# Patient Record
Sex: Male | Born: 1990 | Race: White | Hispanic: No | Marital: Married | Smoking: Current every day smoker
Health system: Southern US, Community
[De-identification: ages and names within clinical notes are randomized; demographics above are authoritative.]

---

## 2014-03-22 ENCOUNTER — Encounter (HOSPITAL_COMMUNITY): Payer: Self-pay | Admitting: Emergency Medicine

## 2014-03-22 ENCOUNTER — Emergency Department (HOSPITAL_COMMUNITY)
Admission: EM | Admit: 2014-03-22 | Discharge: 2014-03-22 | Disposition: A | Discharge status: Home / Self Care | Payer: BC Managed Care – PPO | Attending: Emergency Medicine | Admitting: Emergency Medicine

## 2014-03-22 ENCOUNTER — Emergency Department (HOSPITAL_COMMUNITY): Payer: BC Managed Care – PPO

## 2014-03-22 DIAGNOSIS — N433 Hydrocele, unspecified: Secondary | ICD-10-CM | POA: Insufficient documentation

## 2014-03-22 DIAGNOSIS — N50819 Testicular pain, unspecified: Secondary | ICD-10-CM

## 2014-03-22 DIAGNOSIS — F17200 Nicotine dependence, unspecified, uncomplicated: Secondary | ICD-10-CM | POA: Insufficient documentation

## 2014-03-22 DIAGNOSIS — N509 Disorder of male genital organs, unspecified: Secondary | ICD-10-CM | POA: Insufficient documentation

## 2014-03-22 LAB — CBC WITH DIFFERENTIAL/PLATELET
Basophils Absolute: 0 10*3/uL (ref 0.0–0.1)
Basophils Relative: 0 % (ref 0–1)
Eosinophils Absolute: 0.1 10*3/uL (ref 0.0–0.7)
Eosinophils Relative: 1 % (ref 0–5)
HCT: 45.8 % (ref 39.0–52.0)
Hemoglobin: 16.7 g/dL (ref 13.0–17.0)
LYMPHS ABS: 1.7 10*3/uL (ref 0.7–4.0)
LYMPHS PCT: 15 % (ref 12–46)
MCH: 30.3 pg (ref 26.0–34.0)
MCHC: 36.5 g/dL — ABNORMAL HIGH (ref 30.0–36.0)
MCV: 83.1 fL (ref 78.0–100.0)
Monocytes Absolute: 0.7 10*3/uL (ref 0.1–1.0)
Monocytes Relative: 7 % (ref 3–12)
NEUTROS PCT: 77 % (ref 43–77)
Neutro Abs: 8.4 10*3/uL — ABNORMAL HIGH (ref 1.7–7.7)
PLATELETS: 172 10*3/uL (ref 150–400)
RBC: 5.51 MIL/uL (ref 4.22–5.81)
RDW: 11.6 % (ref 11.5–15.5)
WBC: 10.9 10*3/uL — ABNORMAL HIGH (ref 4.0–10.5)

## 2014-03-22 LAB — URINALYSIS, ROUTINE W REFLEX MICROSCOPIC
BILIRUBIN URINE: NEGATIVE
GLUCOSE, UA: NEGATIVE mg/dL
HGB URINE DIPSTICK: NEGATIVE
KETONES UR: NEGATIVE mg/dL
Leukocytes, UA: NEGATIVE
Nitrite: NEGATIVE
PROTEIN: NEGATIVE mg/dL
Specific Gravity, Urine: 1.016 (ref 1.005–1.030)
UROBILINOGEN UA: 1 mg/dL (ref 0.0–1.0)
pH: 7.5 (ref 5.0–8.0)

## 2014-03-22 LAB — COMPREHENSIVE METABOLIC PANEL
ALK PHOS: 140 U/L — AB (ref 39–117)
ALT: 64 U/L — AB (ref 0–53)
ANION GAP: 14 (ref 5–15)
AST: 31 U/L (ref 0–37)
Albumin: 4.6 g/dL (ref 3.5–5.2)
BILIRUBIN TOTAL: 1.2 mg/dL (ref 0.3–1.2)
BUN: 9 mg/dL (ref 6–23)
CO2: 25 meq/L (ref 19–32)
Calcium: 10.2 mg/dL (ref 8.4–10.5)
Chloride: 101 mEq/L (ref 96–112)
Creatinine, Ser: 0.72 mg/dL (ref 0.50–1.35)
GFR calc Af Amer: >90 mL/min (ref >90)
Glucose, Bld: 107 mg/dL — ABNORMAL HIGH (ref 70–99)
POTASSIUM: 4.2 meq/L (ref 3.7–5.3)
SODIUM: 140 meq/L (ref 137–147)
Total Protein: 8 g/dL (ref 6.0–8.3)

## 2014-03-22 LAB — LIPASE, BLOOD: Lipase: 22 U/L (ref 11–59)

## 2014-03-22 MED ORDER — MORPHINE SULFATE 4 MG/ML IJ SOLN
4.0000 mg | Freq: Once | INTRAMUSCULAR | Status: AC
  Administered 2014-03-22: 4 mg via INTRAVENOUS
  Filled 2014-03-22: qty 1

## 2014-03-22 MED ORDER — ONDANSETRON HCL 4 MG/2ML IJ SOLN
4.0000 mg | Freq: Once | INTRAMUSCULAR | Status: AC
  Administered 2014-03-22: 4 mg via INTRAVENOUS
  Filled 2014-03-22: qty 2

## 2014-03-22 MED ORDER — HYDROCODONE-ACETAMINOPHEN 5-325 MG PO TABS: 1.0000 | ORAL_TABLET | Freq: Four times a day (QID) | ORAL | Status: DC | PRN

## 2014-03-22 MED ORDER — IBUPROFEN 800 MG PO TABS: 800.0000 mg | ORAL_TABLET | Freq: Three times a day (TID) | ORAL | Status: DC

## 2014-03-22 NOTE — ED Provider Notes (Signed)
CSN: [id number]     Arrival date & time 03/22/14  0731 History   First MD Initiated Contact with Patient 03/22/14 0815     Chief Complaint  Patient presents with  . Abdominal Pain  . Testicle Pain     (Consider location/radiation/quality/duration/timing/severity/associated sxs/prior Treatment) Patient is a 23 y.o. male presenting with abdominal pain, testicular pain, and male genitourinary complaint. The history is provided by the patient.  Abdominal Pain Pain location:  LLQ Associated symptoms: no cough, no dysuria, no fever, no shortness of breath and no vomiting   Testicle Pain This is a new problem. The current episode started 3 to 5 hours ago. The problem occurs constantly. The problem has been gradually improving. Pertinent negatives include no abdominal pain and no shortness of breath. Nothing aggravates the symptoms. Nothing relieves the symptoms.  Male GU Problem Presenting symptoms: no dysuria   Associated symptoms: no abdominal pain, no fever and no vomiting     History reviewed. No pertinent past medical history. History reviewed. No pertinent past surgical history. History reviewed. No pertinent family history. History  Substance Use Topics  . Smoking status: Current Every Day Smoker  . Smokeless tobacco: Not on file  . Alcohol Use: Yes    Review of Systems  Constitutional: Negative for fever.  Respiratory: Negative for cough and shortness of breath.   Gastrointestinal: Negative for vomiting and abdominal pain.  Genitourinary: Positive for testicular pain. Negative for dysuria.  All other systems reviewed and are negative.     Allergies  Review of patient's allergies indicates no known allergies.  Home Medications   Prior to Admission medications   Not on File   BP 159/94  Pulse 62  Temp(Src) 98 F (36.7 C) (Oral)  Resp 18  SpO2 100% Physical Exam  Nursing note and vitals reviewed. Constitutional: He is oriented to person, place, and time. He  appears well-developed and well-nourished. No distress.  HENT:  Head: Normocephalic and atraumatic.  Mouth/Throat: Oropharynx is clear and moist. No oropharyngeal exudate.  Eyes: EOM are normal. Pupils are equal, round, and reactive to light.  Neck: Normal range of motion. Neck supple.  Cardiovascular: Normal rate and regular rhythm.  Exam reveals no friction rub.   No murmur heard. Pulmonary/Chest: Effort normal and breath sounds normal. No respiratory distress. He has no wheezes. He has no rales.  Abdominal: He exhibits no distension. There is no tenderness. There is no rebound. Hernia confirmed negative in the right inguinal area and confirmed negative in the left inguinal area.  Genitourinary: Right testis shows no mass, no swelling and no tenderness. Right testis is descended. Left testis shows tenderness. Left testis shows no mass and no swelling. Left testis is descended.  Musculoskeletal: Normal range of motion. He exhibits no edema.  Lymphadenopathy:       Right: No inguinal adenopathy present.       Left: No inguinal adenopathy present.  Neurological: He is alert and oriented to person, place, and time.  Skin: No rash noted. He is not diaphoretic.    ED Course  Procedures (including critical care time) Labs Review Labs Reviewed  CBC WITH DIFFERENTIAL - Abnormal; Notable for the following:    WBC 10.9 (*)    MCHC 36.5 (*)    Neutro Abs 8.4 (*)    All other components within normal limits  COMPREHENSIVE METABOLIC PANEL  LIPASE, BLOOD  URINALYSIS, ROUTINE W REFLEX MICROSCOPIC    Imaging Review No results found.   EKG Interpretation None  MDM   Final diagnoses:  Testicle pain  Hydrocele, bilateral    83M with acute onset of L testicular pain this morning, roughly 4.5 hours ago. No trauma. Pain radiates into LLQ. On exam, no abnormal lie, unable to assess cremasteric reflex on either side. L testicle with tenderness, no epididymal tenderness. Concern for  possible torsion. [place] ordered at 824 am. [place] negative for torsion - bilateral hydroceles, L>R. Patient counseled on supportive briefs. Given motrin, pain meds. Given Urology f/u if needed. Stable for discharge.    Dagmar Hait, MD 03/22/14 1048

## 2014-03-22 NOTE — Discharge Instructions (Signed)
Hydrocele, Adult Fluid can collect around the testicles. This fluid forms in a sac. This condition is called a hydrocele. The collected fluid causes swelling of the scrotum. Usually, it affects just one testicle. Most of the time, the condition does not cause pain. Sometimes, the hydrocele goes away on its own. Other times, surgery is needed to get rid of the fluid. CAUSES A hydrocele does not develop often. Different things can cause a hydrocele in a man, including:  Injury to the scrotum.  Infection.  X-ray of the area around the scrotum.  A tumor or cancer of the testicle.  Twisting of a testicle.  Decreased blood flow to the scrotum. SYMPTOMS   Swelling without pain. The hydrocele feels like a water-filled balloon.  Swelling with pain. This can occur if the hydrocele was caused by infection or twisting.  Mild discomfort in the scrotum.  The hydrocele may feel heavy.  Swelling that gets smaller when you lie down. DIAGNOSIS  Your caregiver will do a physical exam to decide if you have a hydrocele. This may include:  Asking questions about your overall health, today and in the past. Your caregiver may ask about any injuries, X-rays, or infections.  Pushing on your abdomen or asking you to change positions to see if the size of the hydrocele changes.  Shining a light through the scrotum (transillumination) to see if the fluid inside the scrotum is clear.  Blood tests and urine tests to check for infection.  Imaging studies that take pictures of the scrotum and testicles. TREATMENT  Treatment depends in part on what caused the condition. Options include:  Watchful waiting. Your caregiver checks the hydrocele every so often.  Different surgeries to drain the fluid.  A needle may be put into the scrotum to drain fluid (needle aspiration). Fluid often returns after this type of treatment.  A cut (incision) may be made in the scrotum to remove the fluid sac  (hydrocelectomy).  An incision may be made in the groin to repair a hydrocele that has contact with abdominal fluids (communicating hydrocele).  Medicines to treat an infection (antibiotics). HOME CARE INSTRUCTIONS  What you need to do at home may depend on the cause of the hydrocele and type of treatment. In general:  Take all medicine as directed by your caregiver. Follow the directions carefully.  Ask your caregiver if there is anything you should not do while you recover (activities, lifting, work, sex).  If you had surgery to repair a communicating hydrocele, recovery time may vary. Ask you caregiver about your recovery time.  Avoid heavy lifting for 4 to 6 weeks.  If you had an incision on the scrotum or groin, wash it for 2 to 3 days after surgery. Do this as long as the skin is closed and there are no gaps in the wound. Wash gently, and avoid rubbing the incision.  Keep all follow-up appointments. SEEK MEDICAL CARE IF:   Your scrotum seems to be getting larger.  The area becomes more and more uncomfortable. SEEK IMMEDIATE MEDICAL CARE IF:  You have a fever. Document Released: 02/15/2010 Document Revised: 06/18/2013 Document Reviewed: 02/15/2010 Exit[employer] Patient Information 2015 [employer]. This information is not intended to replace advice given to you by your health care provider. Make sure you discuss any questions you have with your health care provider.  

## 2014-03-22 NOTE — ED Notes (Signed)
Pt aware of need for urine specimen. 

## 2014-03-22 NOTE — ED Notes (Signed)
Pt woke up this morning about 4 am w/ lt testicle and low abd pain.  Denies dysuria.  Denies redness or swelling to testicle.  Nausea, no vomiting or diarrhea.

## 2014-03-22 NOTE — ED Notes (Signed)
Patient transported to Ultrasound 

## 2015-09-29 ENCOUNTER — Ambulatory Visit
Admission: RE | Admit: 2015-09-29 | Discharge: 2015-09-29 | Disposition: A | Discharge status: Home / Self Care | Payer: No Typology Code available for payment source | Source: Ambulatory Visit | Attending: Occupational Medicine | Admitting: Occupational Medicine

## 2015-09-29 ENCOUNTER — Other Ambulatory Visit: Payer: Self-pay | Admitting: Occupational Medicine

## 2015-09-29 DIAGNOSIS — Z021 Encounter for pre-employment examination: Secondary | ICD-10-CM

## 2017-08-22 ENCOUNTER — Emergency Department (HOSPITAL_COMMUNITY): Payer: Worker's Compensation

## 2017-08-22 ENCOUNTER — Encounter (HOSPITAL_COMMUNITY): Payer: Self-pay

## 2017-08-22 ENCOUNTER — Emergency Department (HOSPITAL_COMMUNITY)
Admission: EM | Admit: 2017-08-22 | Discharge: 2017-08-22 | Disposition: A | Discharge status: Home / Self Care | Payer: Worker's Compensation | Attending: Emergency Medicine | Admitting: Emergency Medicine

## 2017-08-22 DIAGNOSIS — F17200 Nicotine dependence, unspecified, uncomplicated: Secondary | ICD-10-CM | POA: Diagnosis not present

## 2017-08-22 DIAGNOSIS — M546 Pain in thoracic spine: Secondary | ICD-10-CM | POA: Insufficient documentation

## 2017-08-22 DIAGNOSIS — M549 Dorsalgia, unspecified: Secondary | ICD-10-CM | POA: Diagnosis present

## 2017-08-22 MED ORDER — METHOCARBAMOL 500 MG PO TABS: 1000.0000 mg | ORAL_TABLET | Freq: Three times a day (TID) | ORAL | 0 refills | Status: AC | PRN

## 2017-08-22 MED ORDER — IBUPROFEN 200 MG PO TABS
600.0000 mg | ORAL_TABLET | Freq: Once | ORAL | Status: AC
  Administered 2017-08-22: 600 mg via ORAL
  Filled 2017-08-22: qty 3

## 2017-08-22 MED ORDER — IBUPROFEN 600 MG PO TABS: 600.0000 mg | ORAL_TABLET | Freq: Four times a day (QID) | ORAL | 0 refills | Status: AC | PRN

## 2017-08-22 NOTE — ED Provider Notes (Signed)
[facility]-EMERGENCY DEPT Provider Note   CSN: [id number] Arrival date & time: 08/22/17  [id number]     History   Chief Complaint No chief complaint on file.   HPI Mitchell Ramos is a 26 y.o. male.  HPI   Patient presents with upper back pain after slipping on ice and falling backwards.  States he had the "wind knocked out of him".  Denies loss of consciousness.  Thinks he may have hit his head but denies headache.  Denies any neck pain.  No shortness of breath currently.  No chest pain.  No focal weakness or numbness.    History reviewed. No pertinent past medical history.  There are no active problems to display for this patient.   History reviewed. No pertinent surgical history.     Home Medications    Prior to Admission medications   Medication Sig Start Date End Date Taking? Authorizing Provider  famotidine-calcium carbonate-magnesium hydroxide (PEPCID COMPLETE) [phone] MG chewable tablet Chew 1 tablet by mouth daily as needed (for acid reflex).   Yes [provider]  ibuprofen (ADVIL,MOTRIN) 600 MG tablet Take 1 tablet (600 mg total) by mouth every 6 (six) hours as needed. 08/22/17   Loren RacerYelverton, Jolan Mealor, MD  methocarbamol (ROBAXIN) 500 MG tablet Take 2 tablets (1,000 mg total) by mouth every 8 (eight) hours as needed for muscle spasms. 08/22/17   Loren RacerYelverton, Taylorann Tkach, MD    Family History No family history on file.  Social History Social History   Tobacco Use  . Smoking status: Current Every Day Smoker  Substance Use Topics  . Alcohol use: Yes  . Drug use: No     Allergies   Patient has no known allergies.   Review of Systems Review of Systems  Constitutional: Negative for chills and fever.  HENT: Negative for facial swelling.   Respiratory: Negative for cough and shortness of breath.   Cardiovascular: Negative for chest pain, palpitations and leg swelling.  Gastrointestinal: Negative for abdominal pain, nausea and  vomiting.  Musculoskeletal: Positive for back pain and myalgias. Negative for neck pain and neck stiffness.  Skin: Negative for rash and wound.  Neurological: Negative for dizziness, syncope, weakness, light-headedness and numbness.  All other systems reviewed and are negative.    Physical Exam Updated Vital Signs BP 136/88 (BP Location: Left Arm)   Pulse 63   Temp 97.7 F (36.5 C) (Oral)   Resp 18   SpO2 98%   Physical Exam  Constitutional: He is oriented to person, place, and time. He appears well-developed and well-nourished. No distress.  HENT:  Head: Normocephalic and atraumatic.  Mouth/Throat: Oropharynx is clear and moist.  No obvious scalp trauma.  Eyes: EOM are normal. Pupils are equal, round, and reactive to light.  Neck: Normal range of motion. Neck supple.  No posterior midline cervical tenderness to palpation.  Cardiovascular: Normal rate and regular rhythm. Exam reveals no gallop and no friction rub.  No murmur heard. Pulmonary/Chest: Effort normal and breath sounds normal. No stridor. No respiratory distress. He has no wheezes. He has no rales. He exhibits no tenderness.  Abdominal: Soft. Bowel sounds are normal. There is no tenderness. There is no rebound and no guarding.  Musculoskeletal: Normal range of motion. He exhibits tenderness. He exhibits no edema.  Patient has moderate tenderness to palpation over the medial surface of the right scapula.  No obvious deformity.  Neurological: He is alert and oriented to person, place, and time.  Moves all extremities without focal  deficit.  Sensation fully intact.  Skin: Skin is warm and dry. Capillary refill takes less than 2 seconds. No rash noted. No erythema.  Psychiatric: He has a normal mood and affect. His behavior is normal.  Nursing note and vitals reviewed.    ED Treatments / Results  Labs (all labs ordered are listed, but only abnormal results are displayed) Labs Reviewed - No data to display  EKG   EKG Interpretation None       Radiology Dg Ribs Unilateral W/chest Right  Result Date: 08/22/2017 CLINICAL DATA:  Slipped and fell on ice.  Right-sided chest pain. EXAM: RIGHT RIBS AND CHEST - 3+ VIEW COMPARISON:  None. FINDINGS: Lungs are clear. There is no edema or effusion. The heart size is normal. Dedicated images the ribs demonstrates no acute or healing fractures. There is no pneumothorax. IMPRESSION: Negative chest and right rib radiographs. Electronically Signed   By: Marin Robertshristopher  Mattern M.D.   On: 08/22/2017 09:19    Procedures Procedures (including critical care time)  Medications Ordered in ED Medications  ibuprofen (ADVIL,MOTRIN) tablet 600 mg (600 mg Oral Given 08/22/17 0904)     Initial Impression / Assessment and Plan / ED Course  I have reviewed the triage vital signs and the nursing notes.  Pertinent labs & imaging results that were available during my care of the patient were reviewed by me and considered in my medical decision making (see chart for details).     X-rays without acute findings.  Vital signs stable.  Normal neurologic exam.  Will treat symptomatically.  Return precautions given.  Final Clinical Impressions(s) / ED Diagnoses   Final diagnoses:  Acute right-sided thoracic back pain    ED Discharge Orders        Ordered    ibuprofen (ADVIL,MOTRIN) 600 MG tablet  Every 6 hours PRN     08/22/17 0945    methocarbamol (ROBAXIN) 500 MG tablet  Every 8 hours PRN     08/22/17 0945       Loren RacerYelverton, Willena Jeancharles, MD 08/22/17 0945

## 2017-08-22 NOTE — ED Triage Notes (Signed)
Pt. Arrived via [facility] from the corner of [address]. Pt. C/o intermittent pain in the upper back . No numbness or tingles. Pt. States he as a constant feeling to pop his back.

## 2017-08-22 NOTE — ED Notes (Signed)
Bed: [id number] Expected date:  Expected time:  Means of arrival:  Comments: EMS/fall/flank pain

## 2018-10-08 IMAGING — CR DG RIBS W/ CHEST 3+V*R*
4 series · 4 of 4 positions shown · non-contrast
Comparison: None.

CLINICAL DATA: Slipped and fell on ice.  Right-sided chest pain.

EXAM:
RIGHT RIBS AND CHEST - 3+ VIEW

[w chest pa]
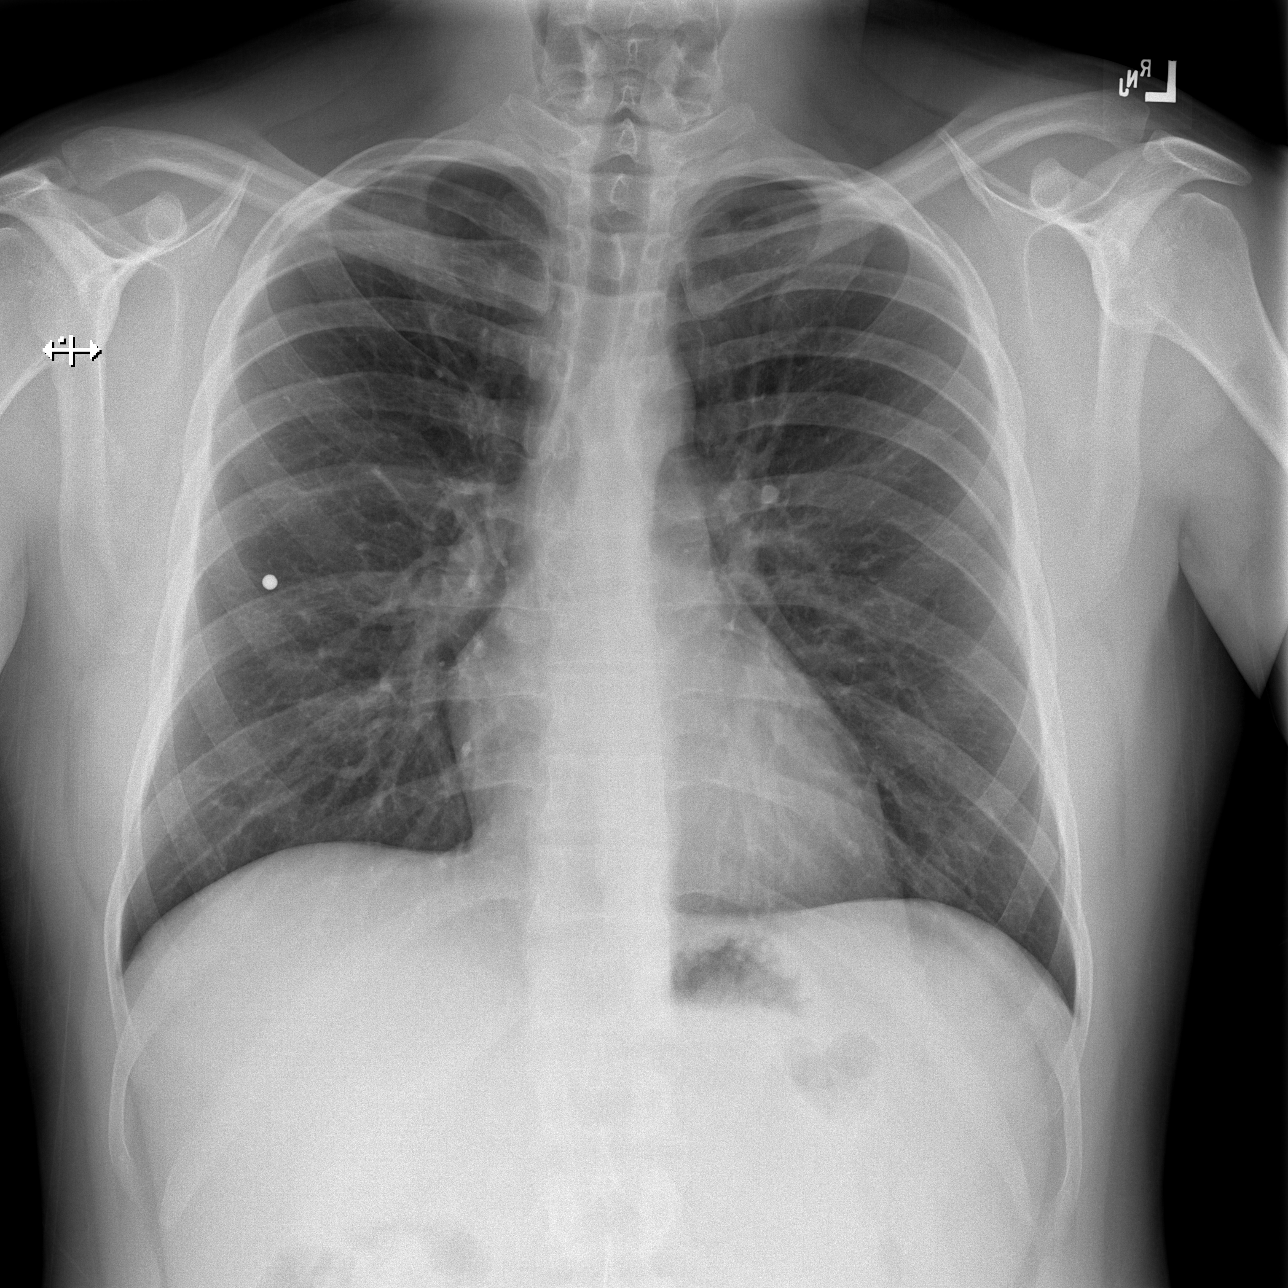

[w ribs ap upper right]
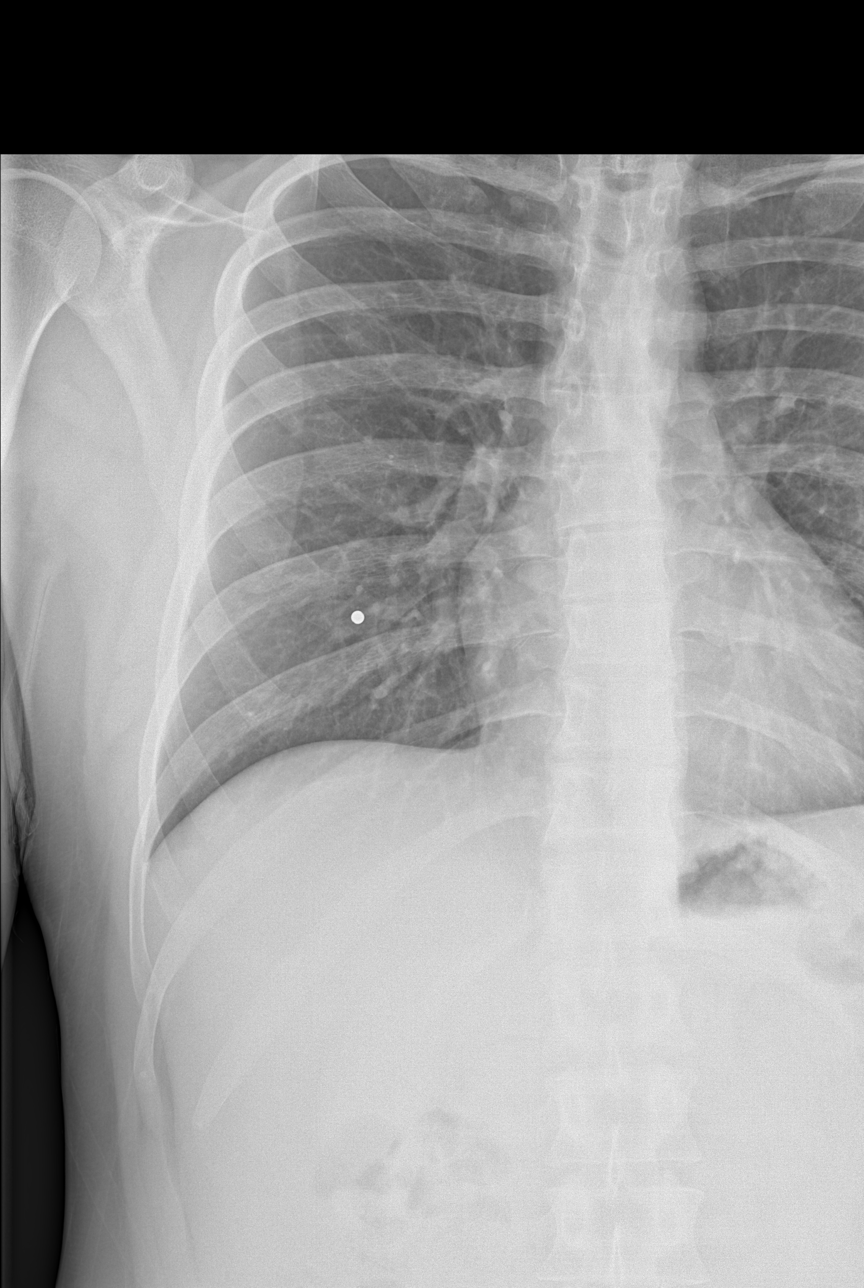

[w ribs obl right (1 of 2)]
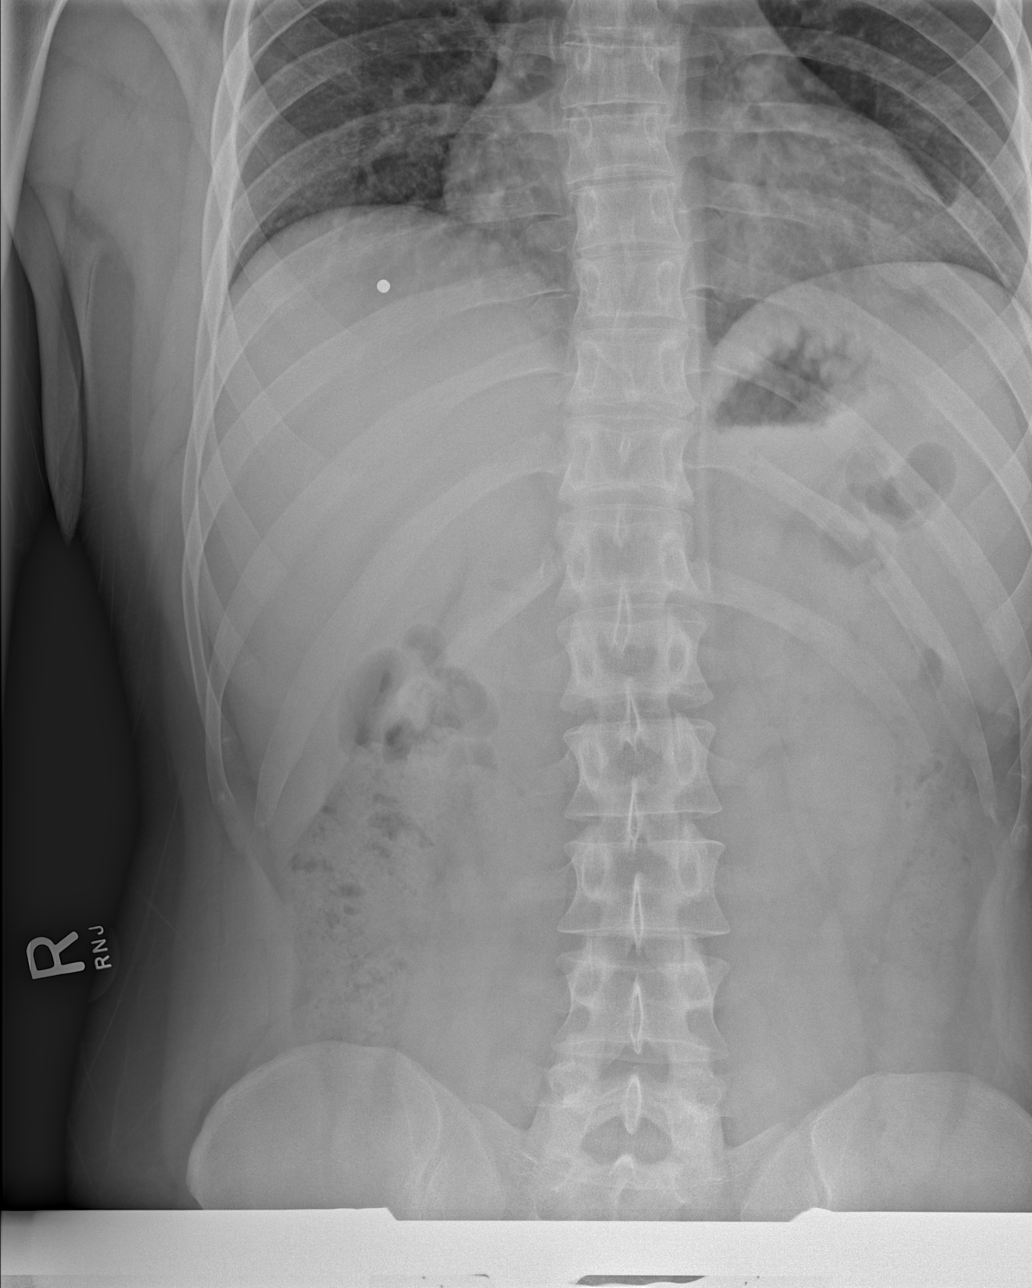

[w ribs obl right (2 of 2)]
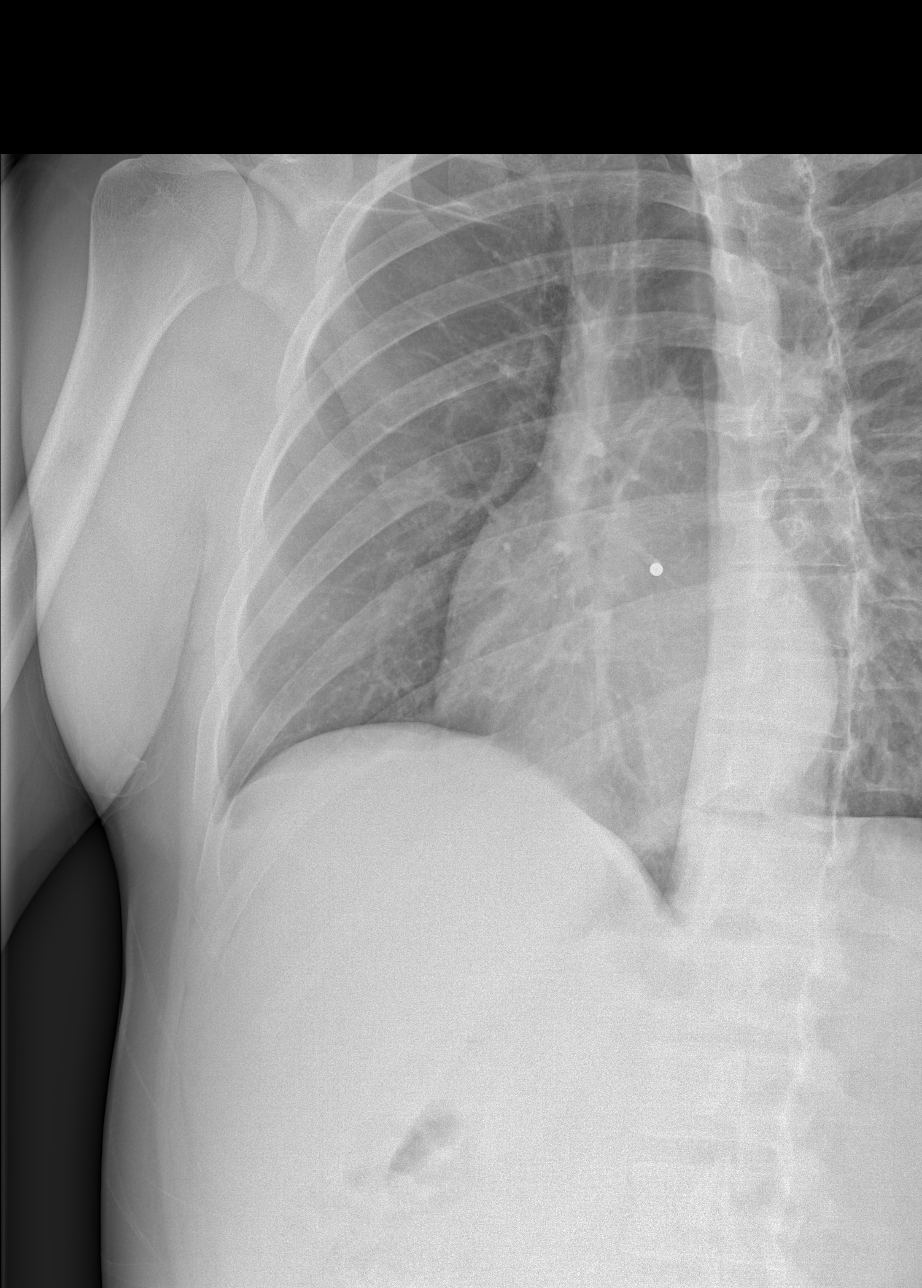

[4 of 4 positions shown; findings below may reference images not displayed]

FINDINGS: Lungs are clear. There is no edema or effusion. The heart size is
normal.

Dedicated images the ribs demonstrates no acute or healing
fractures. There is no pneumothorax.
IMPRESSION: Negative chest and right rib radiographs.

## 2021-02-25 ENCOUNTER — Other Ambulatory Visit: Payer: Self-pay

## 2021-02-25 ENCOUNTER — Emergency Department (INDEPENDENT_AMBULATORY_CARE_PROVIDER_SITE_OTHER)
Admission: EM | Admit: 2021-02-25 | Discharge: 2021-02-25 | Disposition: A | Discharge status: Home / Self Care | Source: Home / Self Care | Attending: Family Medicine | Admitting: Family Medicine

## 2021-02-25 ENCOUNTER — Encounter: Payer: Self-pay | Admitting: Emergency Medicine

## 2021-02-25 DIAGNOSIS — R509 Fever, unspecified: Secondary | ICD-10-CM

## 2021-02-25 DIAGNOSIS — J029 Acute pharyngitis, unspecified: Secondary | ICD-10-CM

## 2021-02-25 LAB — POC SARS CORONAVIRUS 2 AG -  ED: SARS Coronavirus 2 Ag: NEGATIVE

## 2021-02-25 LAB — POCT RAPID STREP A (OFFICE): Rapid Strep A Screen: NEGATIVE

## 2021-02-25 MED ORDER — ACETAMINOPHEN 325 MG PO TABS
650.0000 mg | ORAL_TABLET | Freq: Once | ORAL | Status: AC
  Administered 2021-02-25: 650 mg via ORAL

## 2021-02-25 NOTE — ED Provider Notes (Signed)
[facility]    CSN: [id number] Arrival date & time: 02/25/21  1309      History   Chief Complaint Chief Complaint  Patient presents with   Sore Throat    HPI Mitchell Ramos is a 30 y.o. male.   Patient developed a sore throat, myalgias, fatigue, nasal congestion, and soreness in his neck three days ago. He denies cough, and notes that a home COVID19 test was negative today.  The history is provided by the patient.   History reviewed. No pertinent past medical history.  There are no problems to display for this patient.   History reviewed. No pertinent surgical history.     Home Medications    Prior to Admission medications   Medication Sig Start Date End Date Taking? Authorizing Provider  famotidine-calcium carbonate-magnesium hydroxide (PEPCID COMPLETE) [phone] MG chewable tablet Chew 1 tablet by mouth daily as needed (for acid reflex).    [provider]  ibuprofen (ADVIL,MOTRIN) 600 MG tablet Take 1 tablet (600 mg total) by mouth every 6 (six) hours as needed. 08/22/17   Loren Racer, MD  methocarbamol (ROBAXIN) 500 MG tablet Take 2 tablets (1,000 mg total) by mouth every 8 (eight) hours as needed for muscle spasms. 08/22/17   Loren Racer, MD    Family History No family history on file.  Social History Social History   Tobacco Use   Smoking status: Every Day    Pack years: 0.00   Smokeless tobacco: Never  Substance Use Topics   Alcohol use: Yes   Drug use: No     Allergies   Patient has no known allergies.   Review of Systems Review of Systems + sore throat No cough No pleuritic pain No wheezing + nasal congestion + post-nasal drainage No sinus pain/pressure No itchy/red eyes No earache No hemoptysis No SOB No fever, + chills No nausea No vomiting No abdominal pain No diarrhea No urinary symptoms No skin rash + fatigue + myalgias No headache Used OTC meds (Tylenol) without relief   Physical  Exam Triage Vital Signs ED Triage Vitals  Enc Vitals Group     BP 02/25/21 1503 116/76     Pulse Rate 02/25/21 1503 99     Resp --      Temp 02/25/21 1503 100 F (37.8 C)     Temp Source 02/25/21 1503 Oral     SpO2 02/25/21 1503 98 %     Weight --      Height --      Head Circumference --      Peak Flow --      Pain Score 02/25/21 1504 7     Pain Loc --      Pain Edu? --      Excl. in GC? --    No data found.  Updated Vital Signs BP 116/76 (BP Location: Left Arm)   Pulse 99   Temp 100 F (37.8 C) (Oral)   SpO2 98%   Visual Acuity Right Eye Distance:   Left Eye Distance:   Bilateral Distance:    Right Eye Near:   Left Eye Near:    Bilateral Near:     Physical Exam Nursing notes and Vital Signs reviewed. Appearance:  Patient appears stated age, and in no acute distress Eyes:  Pupils are equal, round, and reactive to light and accomodation.  Extraocular movement is intact.  Conjunctivae are not inflamed  Ears:  Canals normal.  Tympanic membranes normal.  Nose:  Mildly congested turbinates.  No sinus tenderness.   Pharynx:  Mild erythema. Neck:  Supple.  Mildly enlarged lateral nodes are present, tender to palpation on the left.  Tonsillar nodes tender to palpation. Lungs:  Clear to auscultation.  Breath sounds are equal.  Moving air well. Heart:  Regular rate and rhythm without murmurs, rubs, or gallops.  Abdomen:  Nontender without masses or hepatosplenomegaly.  Bowel sounds are present.  No CVA or flank tenderness.  Extremities:  No edema.  Skin:  No rash present.   UC Treatments / Results  Labs (all labs ordered are listed, but only abnormal results are displayed) Labs Reviewed  NOVEL CORONAVIRUS, NAA  CULTURE, GROUP A STREP  POC SARS CORONAVIRUS 2 AG -  ED negative  POCT RAPID STREP A (OFFICE) negative    EKG   Radiology No results found.  Procedures Procedures (including critical care time)  Medications Ordered in UC Medications   acetaminophen (TYLENOL) tablet 650 mg (650 mg Oral Given 02/25/21 1512)    Initial Impression / Assessment and Plan / UC Course  I have reviewed the triage vital signs and the nursing notes.  Pertinent labs & imaging results that were available during my care of the patient were reviewed by me and considered in my medical decision making (see chart for details).    Benign exam.  Suspect viral URI. There is no evidence of bacterial infection today. Treat symptomatically for now  COVID19 PCR and Strep A DNA probe pending.   Final Clinical Impressions(s) / UC Diagnoses   Final diagnoses:  Fever, unspecified  Acute pharyngitis, unspecified etiology     Discharge Instructions      Take plain guaifenesin (1200mg  extended release tabs such as Mucinex) twice daily, with plenty of water, for cough and congestion.  May add Pseudoephedrine (30mg , one or two every 4 to 6 hours) for sinus congestion.  Get adequate rest.   May take Delsym Cough Suppressant ("12 Hour Cough Relief") at bedtime for nighttime cough.  Try warm salt water gargles for sore throat.  Stop all antihistamines for now, and other non-prescription cough/cold preparations. May take Ibuprofen 200mg , 4 tabs every 8 hours with food for sore throat, body aches, etc.   If your COVID-19 test is positive, isolate yourself for five days from the time of your symptom onset.  At the end of five days you may end isolation if your symptoms have cleared or improved, and you have not had a fever for 24 hours. At this time you should wear a mask for five more days when you are around others.         ED Prescriptions   None       , MD 02/27/21 1438

## 2021-02-25 NOTE — ED Triage Notes (Signed)
Patient c/o sore throat, body aches, feeling fatigue x 3 days.  Patient has taken Tylenol.  Home COVID test negative today.  Patient is vaccinated for COVID.

## 2021-02-25 NOTE — Discharge Instructions (Addendum)
Take plain guaifenesin (1200mg  extended release tabs such as Mucinex) twice daily, with plenty of water, for cough and congestion.  May add Pseudoephedrine (30mg , one or two every 4 to 6 hours) for sinus congestion.  Get adequate rest.   May take Delsym Cough Suppressant ("12 Hour Cough Relief") at bedtime for nighttime cough.  Try warm salt water gargles for sore throat.  Stop all antihistamines for now, and other non-prescription cough/cold preparations. May take Ibuprofen 200mg , 4 tabs every 8 hours with food for sore throat, body aches, etc.   If your COVID-19 test is positive, isolate yourself for five days from the time of your symptom onset.  At the end of five days you may end isolation if your symptoms have cleared or improved, and you have not had a fever for 24 hours. At this time you should wear a mask for five more days when you are around others.

## 2021-02-26 LAB — SARS-COV-2, NAA 2 DAY TAT

## 2021-02-26 LAB — NOVEL CORONAVIRUS, NAA: SARS-CoV-2, NAA: NOT DETECTED

## 2021-03-01 LAB — CULTURE, GROUP A STREP: Strep A Culture: NEGATIVE
